# Patient Record
Sex: Female | Born: 2000 | Race: White | Hispanic: No | Marital: Single
Health system: Southern US, Community
[De-identification: ages and names within clinical notes are randomized; demographics above are authoritative.]

---
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
## 2020-01-25 ENCOUNTER — Ambulatory Visit: Payer: Self-pay | Attending: Internal Medicine

## 2020-01-25 DIAGNOSIS — Z23 Encounter for immunization: Secondary | ICD-10-CM

## 2020-01-25 NOTE — Progress Notes (Signed)
   Covid-19 Vaccination Clinic  Name:  Desiree Mccoy    MRN: [id number] DOB: 2001-03-21  01/25/2020  [name] was observed post Covid-19 immunization for 15 minutes without incident. She was provided with Vaccine Information Sheet and instruction to access the V-Safe system.   [name] was instructed to call 911 with any severe reactions post vaccine: Marland Kitchen Difficulty breathing  . Swelling of face and throat  . A fast heartbeat  . A bad rash all over body  . Dizziness and weakness   Immunizations Administered    Name Date Dose VIS Date Route   Pfizer COVID-19 Vaccine 01/25/2020  2:09 PM 0.3 mL 10/16/2019 Intramuscular   Manufacturer: [employer], [employer]   Lot: [id number]   NDC: [address]-2

## 2020-02-15 ENCOUNTER — Ambulatory Visit: Payer: Self-pay | Attending: Internal Medicine

## 2020-02-15 DIAGNOSIS — Z23 Encounter for immunization: Secondary | ICD-10-CM

## 2020-02-15 NOTE — Progress Notes (Signed)
   Covid-19 Vaccination Clinic  Name:  Aloise Copus    MRN: [id number] DOB: 2001-09-10  02/15/2020  Ms. Basic was observed post Covid-19 immunization for 15 minutes without incident. She was provided with Vaccine Information Sheet and instruction to access the V-Safe system.   Ms. Iannelli was instructed to call 911 with any severe reactions post vaccine: Marland Kitchen Difficulty breathing  . Swelling of face and throat  . A fast heartbeat  . A bad rash all over body  . Dizziness and weakness   Immunizations Administered    Name Date Dose VIS Date Route   Pfizer COVID-19 Vaccine 02/15/2020  1:44 PM 0.3 mL 10/16/2019 Intramuscular   Manufacturer: [employer], [employer]   Lot: [id number]   NDC: [address]-3

## 2020-11-12 ENCOUNTER — Emergency Department: Payer: Self-pay

## 2020-11-12 ENCOUNTER — Other Ambulatory Visit: Payer: Self-pay

## 2020-11-12 ENCOUNTER — Emergency Department
Admission: EM | Admit: 2020-11-12 | Discharge: 2020-11-12 | Disposition: A | Discharge status: Home / Self Care | Payer: Self-pay | Attending: Emergency Medicine | Admitting: Emergency Medicine

## 2020-11-12 DIAGNOSIS — Y908 Blood alcohol level of 240 mg/100 ml or more: Secondary | ICD-10-CM | POA: Insufficient documentation

## 2020-11-12 DIAGNOSIS — E876 Hypokalemia: Secondary | ICD-10-CM | POA: Insufficient documentation

## 2020-11-12 DIAGNOSIS — E860 Dehydration: Secondary | ICD-10-CM | POA: Insufficient documentation

## 2020-11-12 LAB — CBC WITH DIFFERENTIAL/PLATELET
Abs Immature Granulocytes: 0.04 10*3/uL (ref 0.00–0.07)
Basophils Absolute: 0.1 10*3/uL (ref 0.0–0.1)
Basophils Relative: 1 %
Eosinophils Absolute: 0.1 10*3/uL (ref 0.0–0.5)
Eosinophils Relative: 1 %
HCT: 36.7 % (ref 36.0–46.0)
Hemoglobin: 12.8 g/dL (ref 12.0–15.0)
Immature Granulocytes: 0 %
Lymphocytes Relative: 52 %
Lymphs Abs: 6.2 10*3/uL — ABNORMAL HIGH (ref 0.7–4.0)
MCH: 33.2 pg (ref 26.0–34.0)
MCHC: 34.9 g/dL (ref 30.0–36.0)
MCV: 95.3 fL (ref 80.0–100.0)
Monocytes Absolute: 0.8 10*3/uL (ref 0.1–1.0)
Monocytes Relative: 7 %
Neutro Abs: 4.5 10*3/uL (ref 1.7–7.7)
Neutrophils Relative %: 39 %
Platelets: 290 10*3/uL (ref 150–400)
RBC: 3.85 MIL/uL — ABNORMAL LOW (ref 3.87–5.11)
RDW: 11.5 % (ref 11.5–15.5)
WBC: 11.7 10*3/uL — ABNORMAL HIGH (ref 4.0–10.5)
nRBC: 0 % (ref 0.0–0.2)

## 2020-11-12 LAB — CBG MONITORING, ED: Glucose-Capillary: 98 mg/dL (ref 70–99)

## 2020-11-12 LAB — COMPREHENSIVE METABOLIC PANEL
ALT: 13 U/L (ref 0–44)
AST: 25 U/L (ref 15–41)
Albumin: 4.3 g/dL (ref 3.5–5.0)
Alkaline Phosphatase: 58 U/L (ref 38–126)
Anion gap: 11 (ref 5–15)
BUN: 11 mg/dL (ref 6–20)
CO2: 24 mmol/L (ref 22–32)
Calcium: 8.8 mg/dL — ABNORMAL LOW (ref 8.9–10.3)
Chloride: 104 mmol/L (ref 98–111)
Creatinine, Ser: 0.94 mg/dL (ref 0.44–1.00)
GFR, Estimated: >60 mL/min (ref >60)
Glucose, Bld: 108 mg/dL — ABNORMAL HIGH (ref 70–99)
Potassium: 2.8 mmol/L — ABNORMAL LOW (ref 3.5–5.1)
Sodium: 139 mmol/L (ref 135–145)
Total Bilirubin: 1 mg/dL (ref 0.3–1.2)
Total Protein: 7.8 g/dL (ref 6.5–8.1)

## 2020-11-12 LAB — SALICYLATE LEVEL: Salicylate Lvl: <7 mg/dL — ABNORMAL LOW (ref 7.0–30.0)

## 2020-11-12 LAB — ETHANOL: Alcohol, Ethyl (B): 294 mg/dL — ABNORMAL HIGH (ref <10)

## 2020-11-12 LAB — ACETAMINOPHEN LEVEL: Acetaminophen (Tylenol), Serum: <10 ug/mL — ABNORMAL LOW (ref 10–30)

## 2020-11-12 LAB — MAGNESIUM: Magnesium: 2.1 mg/dL (ref 1.7–2.4)

## 2020-11-12 LAB — HCG, QUANTITATIVE, PREGNANCY: hCG, Beta Chain, Quant, S: <1 m[IU]/mL (ref <5)

## 2020-11-12 MED ORDER — ONDANSETRON HCL 4 MG/2ML IJ SOLN
4.0000 mg | Freq: Once | INTRAMUSCULAR | Status: AC
  Administered 2020-11-12: 4 mg via INTRAVENOUS
  Filled 2020-11-12: qty 2

## 2020-11-12 MED ORDER — POTASSIUM CHLORIDE 10 MEQ/100ML IV SOLN
10.0000 meq | INTRAVENOUS | Status: AC
  Administered 2020-11-12 (×3): 10 meq via INTRAVENOUS
  Filled 2020-11-12 (×2): qty 100

## 2020-11-12 MED ORDER — LACTATED RINGERS IV BOLUS
1000.0000 mL | Freq: Once | INTRAVENOUS | Status: AC
  Administered 2020-11-12: 1000 mL via INTRAVENOUS

## 2020-11-12 NOTE — ED Notes (Signed)
Pt vomited while in CT and they were unable to complete the scan. Pt cleaned and linens changed upon return. Will try CT again after zofran has had time to take effect.

## 2020-11-12 NOTE — ED Triage Notes (Signed)
Pt arrives to ED from "post office" by [facility] with c/c of alcohol intoxication. EMS reports transport vitals of 119/52, p91, O2 sats 100%. Upon arrival, pt responsive to voice. Verbal responses incoherent. Dr Katrinka Blazing at bedside.

## 2020-11-12 NOTE — ED Notes (Signed)
Pt resting comfortably at this time. Pt is sobbing at this time and states she does not know why. Pt educated to lay down until sober enough to find a ride home. Pt is no distress at this time. Pt is in vision of this nurse.

## 2020-11-12 NOTE — ED Notes (Signed)
Pt sleeping, even and unlabored respirations

## 2020-11-12 NOTE — ED Provider Notes (Signed)
[facility] Emergency Department Provider Note  ____________________________________________   Event Date/Time   First MD Initiated Contact with Patient 11/12/20 0200     (approximate)  I have reviewed the triage vital signs and the nursing notes.   HISTORY  Chief Complaint Alcohol Intoxication   HPI Desiree Mccoy is a 20 y.o. female patient presents via EMS from [facility] campus for assessment of altered mental status and concern for possible alcohol intoxication.  Patient was reportedly found by bystanders on the ground outside the campus post office altered.  Patient was able to tell EMS that she is not in pain but unable to write any additional history.  She does not respond to any questions on arrival.  Per EMS she had stable vital signs and normal blood sugar in route.  She did vomit once.         History reviewed. No pertinent past medical history.  There are no problems to display for this patient.   History reviewed. No pertinent surgical history.  Prior to Admission medications   Not on File    Allergies Patient has no allergy information on record.  No family history on file.  Social History    Review of Systems  Review of Systems  Unable to perform ROS: Mental status change      ____________________________________________   PHYSICAL EXAM:  VITAL SIGNS: ED Triage Vitals  Enc Vitals Group     BP      Pulse      Resp      Temp      Temp src      SpO2      Weight      Height      Head Circumference      Peak Flow      Pain Score      Pain Loc      Pain Edu?      Excl. in GC?    Vitals:   11/12/20 0206  BP: (!) 90/53  Pulse: 73  Resp: (!) 22  SpO2: 96%   Physical Exam Vitals and nursing note reviewed.  Constitutional:      Appearance: She is ill-appearing.  HENT:     Head: Normocephalic and atraumatic.     Right Ear: External ear normal.     Left Ear: External ear normal.     Nose:  Nose normal.     Mouth/Throat:     Mouth: Mucous membranes are dry.  Eyes:     Conjunctiva/sclera: Conjunctivae normal.     Pupils: Pupils are equal, round, and reactive to light.  Cardiovascular:     Rate and Rhythm: Normal rate and regular rhythm.  Pulmonary:     Effort: Pulmonary effort is normal. No respiratory distress.     Breath sounds: No stridor. No wheezing, rhonchi or rales.  Chest:     Chest wall: No tenderness.  Abdominal:     General: There is no distension.     Tenderness: There is no abdominal tenderness.  Musculoskeletal:     Right lower leg: No edema.     Left lower leg: No edema.  Skin:    Capillary Refill: Capillary refill takes 2 to 3 seconds.  Neurological:     Mental Status: She is lethargic.      ____________________________________________   LABS (all labs ordered are listed, but only abnormal results are displayed)  Labs Reviewed  CBC WITH DIFFERENTIAL/PLATELET - Abnormal; Notable for  the following components:      Result Value   WBC 11.7 (*)    RBC 3.85 (*)    Lymphs Abs 6.2 (*)    All other components within normal limits  COMPREHENSIVE METABOLIC PANEL - Abnormal; Notable for the following components:   Potassium 2.8 (*)    Glucose, Bld 108 (*)    Calcium 8.8 (*)    All other components within normal limits  ETHANOL - Abnormal; Notable for the following components:   Alcohol, Ethyl (B) 294 (*)    All other components within normal limits  ACETAMINOPHEN LEVEL - Abnormal; Notable for the following components:   Acetaminophen (Tylenol), Serum <10 (*)    All other components within normal limits  SALICYLATE LEVEL - Abnormal; Notable for the following components:   Salicylate Lvl <7.0 (*)    All other components within normal limits  HCG, QUANTITATIVE, PREGNANCY  MAGNESIUM  URINE DRUG SCREEN, QUALITATIVE ([facility] ONLY)  LACTIC ACID, PLASMA  LACTIC ACID, PLASMA  URINALYSIS, COMPLETE (UACMP) WITH MICROSCOPIC  CBG MONITORING, ED    ____________________________________________  ____________________________________________  RADIOLOGY  ED MD interpretation: Chest x-ray has no evidence of pneumonia, pneumothorax, effusion, edema, or other acute intrathoracic process.  CT head has no evidence of occult injury or intracranial hemorrhage.  No other acute intracranial process.  Official radiology report(s): DG Chest 1 View  Result Date: 11/12/2020 CLINICAL DATA:  Altered mental status, hypotensive, alcohol intoxication and emesis with possible aspiration. EXAM: CHEST  1 VIEW COMPARISON:  None. FINDINGS: No consolidation, features of edema, pneumothorax, or effusion. Pulmonary vascularity is normally distributed. The cardiomediastinal contours are unremarkable. No acute osseous or soft tissue abnormality. Telemetry leads overlie the chest. IMPRESSION: No acute cardiopulmonary abnormality. Electronically Signed   By: Kreg Shropshire M.D.   On: 11/12/2020 04:01   CT Head Wo Contrast  Result Date: 11/12/2020 CLINICAL DATA:  Mental status change with unknown cause EXAM: CT HEAD WITHOUT CONTRAST TECHNIQUE: Contiguous axial images were obtained from the base of the skull through the vertex without intravenous contrast. COMPARISON:  None. FINDINGS: Brain: No evidence of acute infarction, hemorrhage, hydrocephalus, extra-axial collection or mass lesion/mass effect. Vascular: No hyperdense vessel or unexpected calcification. Skull: Normal. Negative for fracture or focal lesion. Sinuses/Orbits: No acute finding. IMPRESSION: Normal head CT. Electronically Signed   By: Marnee Spring M.D.   On: 11/12/2020 06:00    ____________________________________________   PROCEDURES  Procedure(s) performed (including Critical Care):  .1-3 Lead EKG Interpretation Performed by: Gilles Chiquito, MD Authorized by: Gilles Chiquito, MD     Interpretation: normal     ECG rate assessment: normal     Rhythm: sinus rhythm     Ectopy: none      Conduction: normal       ____________________________________________   INITIAL IMPRESSION / ASSESSMENT AND PLAN / ED COURSE      Patient presents with above to history exam for assessment of altered mental status per EMS.  Patient is unable provide any history on arrival and no other history is immediately available patient arrival.  On arrival patient does appear lethargic there are no obvious signs of trauma patient is not diaphoretic.  There is no clear toxidrome on initial exam aside from patient appearing very sedated.  She is slightly tachypneic at 22 and hypertensive with a BP of 90/53.  Differential includes but is not limited to occult trauma given no evidence of this on exam, metabolic derangements, intoxication, and an acute infection.  CT head and chest x-ray are both unremarkable.  No evidence on exam of acute infectious process.  CBC with mild leukocytosis but no acute anemia and unremarkable platelets.  CMP remarkable for potassium of 2.8 otherwise no significant ultralight or metabolic derangements.  Serum ethanol 294.  Acetaminophen and salicylate levels undetectable.  Magnesium unremarkable and hCG is negative.  Patient was observed for approximately 5 hours emergency room and on several reassessments had improving mentation.  She was eventually able to speak clearly and states she did not recall what happened last night.  She was observed to ambulate with steady gait unassisted emergency room.  Impression is severe alcohol intoxication.  On final reassessment believe patient is clinically sober and she denies any SI or HI.  Counseled patient on dangers of binge drinking and alcohol poisoning.  Discharged stable condition.  Return precautions advised and discussed.       ____________________________________________   FINAL CLINICAL IMPRESSION(S) / ED DIAGNOSES  Final diagnoses:  Dehydration  Hypokalemia  Blood alcohol level of 240 mg/100 ml or more     Medications  potassium chloride 10 mEq in 100 mL IVPB (10 mEq Intravenous New Bag/Given 11/12/20 0602)  ondansetron (ZOFRAN) injection 4 mg (4 mg Intravenous Given 11/12/20 0243)  lactated ringers bolus 1,000 mL (0 mLs Intravenous Stopped 11/12/20 0601)  lactated ringers bolus 1,000 mL (1,000 mLs Intravenous New Bag/Given 11/12/20 0615)     ED Discharge Orders    None       Note:  This document was prepared using Dragon voice recognition software and may include unintentional dictation errors.   Gilles Chiquito, MD 11/12/20 [phone]

## 2020-11-12 NOTE — ED Notes (Signed)
Pt ambulatory in hall to toilet without difficulty  EDP reports no urine collection needed  Pt given clothes that Jade left for pt

## 2020-11-12 NOTE — ED Notes (Signed)
D/C and educated on binge drinking and the dangers of drinking underage, pt verbalized understanding. Pt refused D/C vitals. Friend here to pick pt up. NAD noted. Pt still emotional on D/C.

## 2020-11-12 NOTE — ED Notes (Signed)
Pt's friend Lesly Rubenstein arrived. She stated that she was not with the patient tonight until she received a call just before EMS transported to the hospital. She will be available to pick patient up upon discharge. Lesly Rubenstein gave her contact number as [phone]

## 2020-11-12 NOTE — ED Notes (Signed)
Pt speaking to parents at this time. Parents state they are going to fly down from [place] today. Pt still emotional and will take Crump home when less intoxicated.

## 2020-11-12 NOTE — ED Notes (Signed)
Pt refusing vitals at this time and states "I'm just to emotional and want to be left alone".

## 2020-11-12 NOTE — ED Notes (Signed)
Pt removed IV, pt now crying, unable to console pt

## 2020-11-12 NOTE — ED Notes (Signed)
Desiree Mccoy leaving bedside att, asked that this RN call father, friend phone number in blank note -will return approx 9 am, prior RN Elijah Birk reports that safety staff from [facility] will be here in the am

## 2022-01-09 IMAGING — DX DG CHEST 1V
1 series · 1 of 1 positions shown · non-contrast
Comparison: None.

CLINICAL DATA: Altered mental status, hypotensive, alcohol
intoxication and emesis with possible aspiration.

EXAM:
CHEST  1 VIEW

[chest ap]
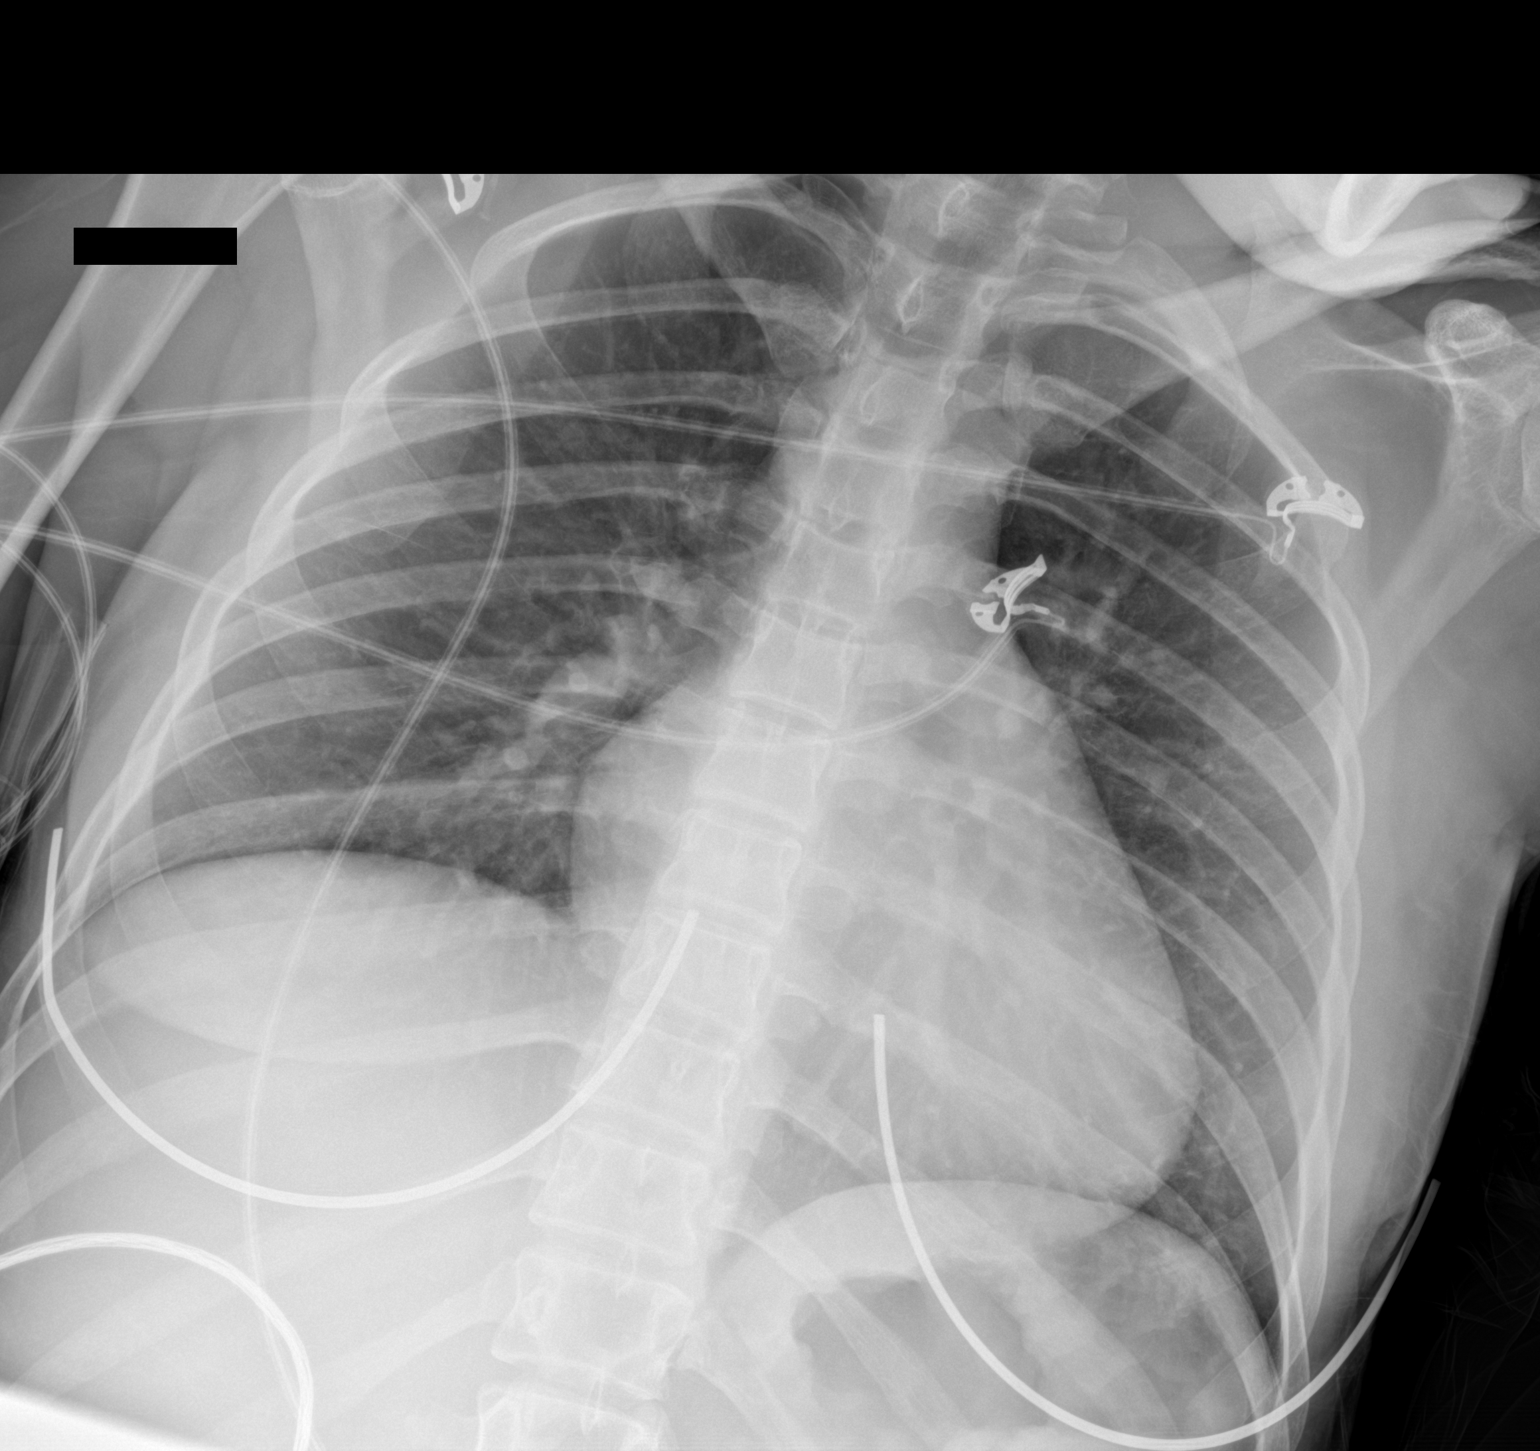

[1 of 1 positions shown; findings below may reference images not displayed]

FINDINGS: No consolidation, features of edema, pneumothorax, or effusion.
Pulmonary vascularity is normally distributed. The cardiomediastinal
contours are unremarkable. No acute osseous or soft tissue
abnormality. Telemetry leads overlie the chest.
IMPRESSION: No acute cardiopulmonary abnormality.
